# Patient Record
Sex: Male | Born: 1995 | Race: White | Hispanic: Yes | Marital: Single | Smoking: Current some day smoker
Health system: Southern US, Community
[De-identification: ages and names within clinical notes are randomized; demographics above are authoritative.]

---
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
## 2017-05-26 IMAGING — CR DG TOE GREAT 2+V*L*
3 series · 3 of 3 positions shown · non-contrast
Comparison: None.

CLINICAL DATA: Pain in DIP of great toe.  No known injury.

EXAM:
LEFT GREAT TOE

[t toes ap left]
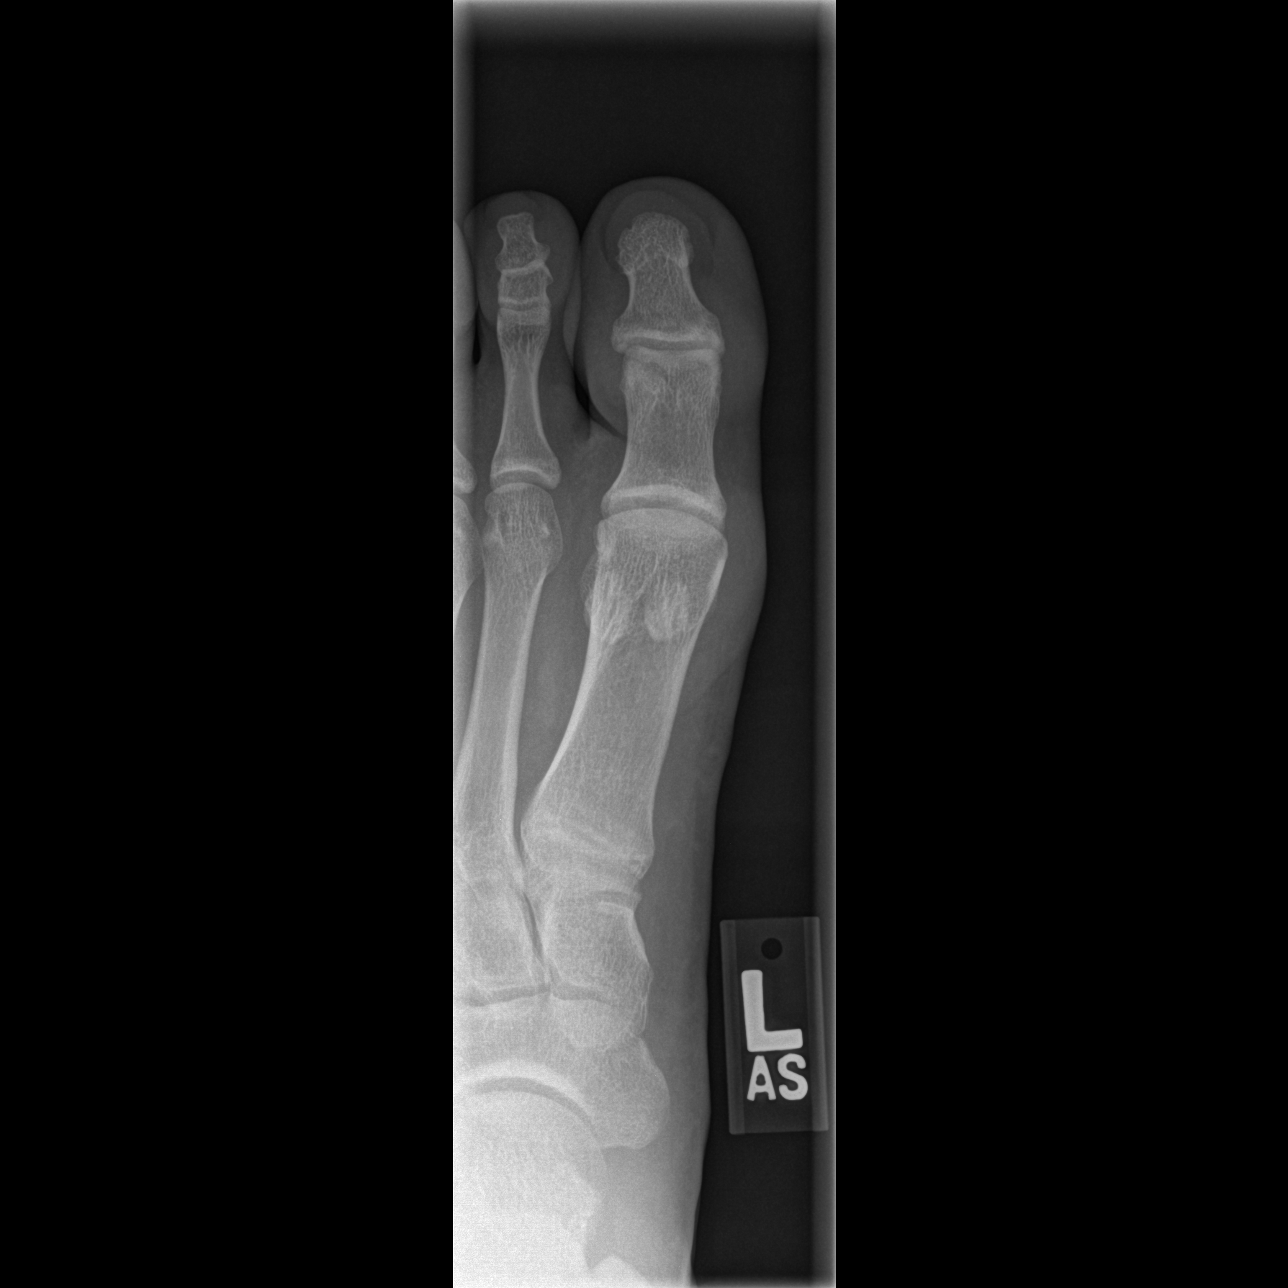

[t toes oblique left]
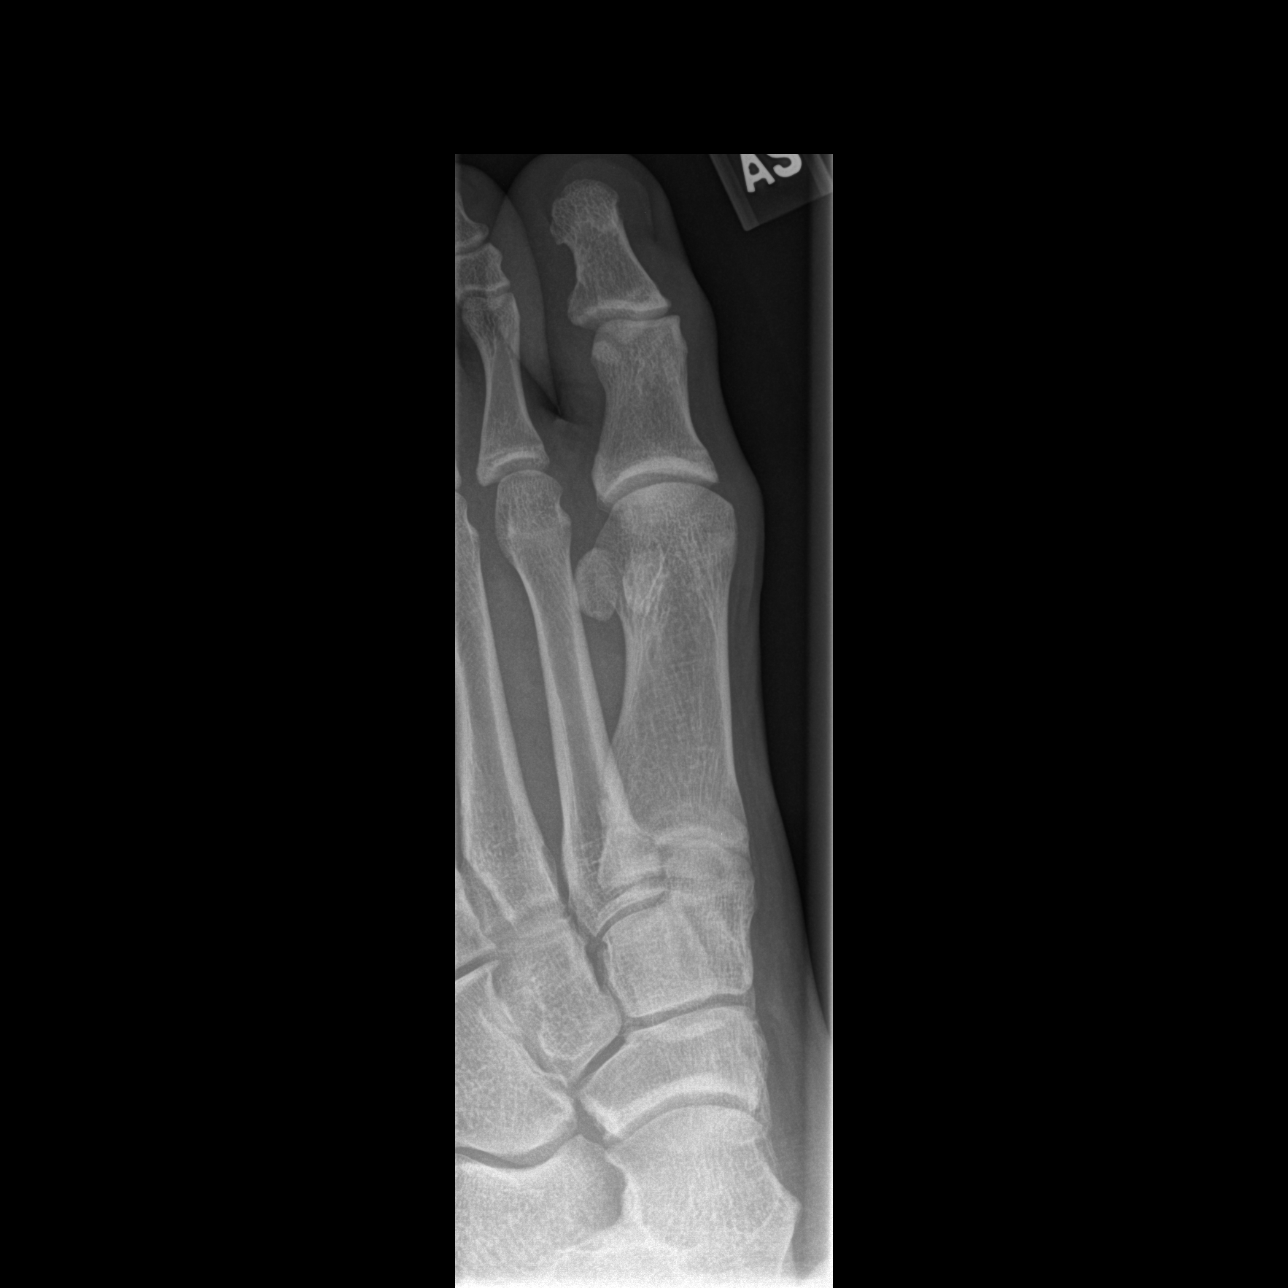

[t toes lateral left]
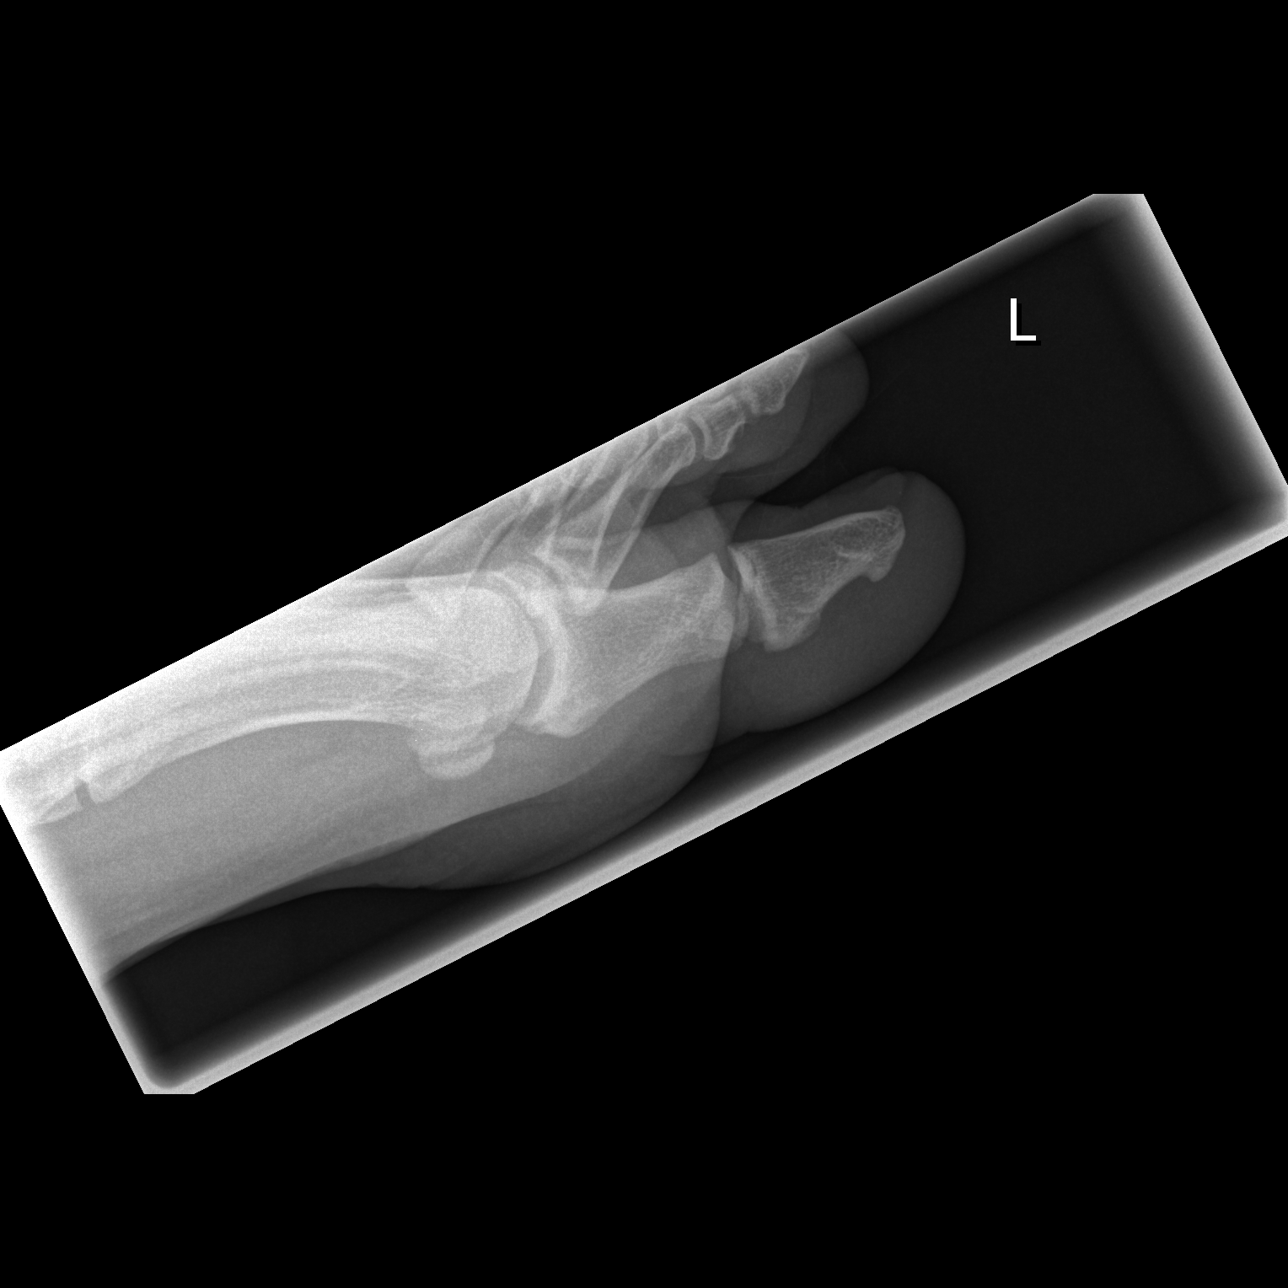

[3 of 3 positions shown; findings below may reference images not displayed]

FINDINGS: There is no evidence of fracture or dislocation. There is no
evidence of arthropathy or other focal bone abnormality. Soft
tissues are unremarkable.
IMPRESSION: Negative.
